# Patient Record
Sex: Female | Born: 2005 | Race: White | Hispanic: No | Marital: Single | Smoking: Never smoker
Health system: Southern US, Community
[De-identification: ages and names within clinical notes are randomized; demographics above are authoritative.]

## PROBLEM LIST (undated history)

## (undated) DIAGNOSIS — Z789 Other specified health status: Secondary | ICD-10-CM

## (undated) HISTORY — DX: Other specified health status: Z78.9

---
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
## 2024-02-15 ENCOUNTER — Telehealth: Payer: Self-pay

## 2024-02-15 NOTE — Telephone Encounter (Signed)
 pt and her mother was given the information per dr. susen orders.

## 2024-02-15 NOTE — Telephone Encounter (Signed)
 pt mother called left message that her and lyndee was talking and she states that [place] stated she like to come off the zoloft . pt was last seen on 2-20 next appt 9-29

## 2024-02-15 NOTE — Telephone Encounter (Signed)
 If she is doing well, you have her take 1.5 tablets of Zoloft  100 mg instead of 2 tablets of Zoloft  100 mg daily, and would advise to not cut anymore until their next appointment. Thanks

## 2024-05-21 ENCOUNTER — Telehealth: Admitting: Child and Adolescent Psychiatry

## 2024-07-09 ENCOUNTER — Telehealth (INDEPENDENT_AMBULATORY_CARE_PROVIDER_SITE_OTHER): Admitting: Child and Adolescent Psychiatry

## 2024-07-09 DIAGNOSIS — Z91199 Patient's noncompliance with other medical treatment and regimen due to unspecified reason: Secondary | ICD-10-CM

## 2024-07-09 NOTE — Progress Notes (Signed)
Pt was sent link via text and email to connect on video for telemedicine encounter for scheduled appointment, and was also followed up with phone call. Pt did not connect on the video, and writer left the VM requesting to connect on the video or call back to reschedule appointment if they are not able to connect today for appointment.
